# Patient Record
Sex: Male | Born: 1970 | Race: White | Hispanic: No | Marital: Married | Smoking: Former smoker
Health system: Southern US, Community
[De-identification: ages and names within clinical notes are randomized; demographics above are authoritative.]

## PROBLEM LIST (undated history)

## (undated) DIAGNOSIS — E786 Lipoprotein deficiency: Secondary | ICD-10-CM

## (undated) DIAGNOSIS — G4733 Obstructive sleep apnea (adult) (pediatric): Secondary | ICD-10-CM

## (undated) DIAGNOSIS — I10 Essential (primary) hypertension: Secondary | ICD-10-CM

## (undated) HISTORY — DX: Lipoprotein deficiency: E78.6

## (undated) HISTORY — PX: WRIST FRACTURE SURGERY: SHX121

## (undated) HISTORY — PX: APPENDECTOMY: SHX54

## (undated) HISTORY — DX: Obstructive sleep apnea (adult) (pediatric): G47.33

## (undated) HISTORY — DX: Essential (primary) hypertension: I10

---

## 2009-08-21 HISTORY — PX: DOPPLER ECHOCARDIOGRAPHY: SHX263

## 2010-03-23 ENCOUNTER — Ambulatory Visit: Payer: Self-pay | Admitting: Cardiology

## 2010-03-23 ENCOUNTER — Ambulatory Visit (HOSPITAL_COMMUNITY): Admission: RE | Admit: 2010-03-23 | Discharge: 2010-03-23 | Payer: Self-pay | Admitting: Cardiology

## 2010-04-08 ENCOUNTER — Ambulatory Visit: Payer: Self-pay | Admitting: Cardiology

## 2012-01-01 ENCOUNTER — Encounter: Payer: Self-pay | Admitting: *Deleted

## 2012-07-27 ENCOUNTER — Encounter (HOSPITAL_BASED_OUTPATIENT_CLINIC_OR_DEPARTMENT_OTHER): Payer: Self-pay | Admitting: *Deleted

## 2012-07-27 ENCOUNTER — Emergency Department (HOSPITAL_BASED_OUTPATIENT_CLINIC_OR_DEPARTMENT_OTHER)
Admission: EM | Admit: 2012-07-27 | Discharge: 2012-07-27 | Disposition: A | Discharge status: Home / Self Care | Payer: BC Managed Care – PPO | Attending: Emergency Medicine | Admitting: Emergency Medicine

## 2012-07-27 DIAGNOSIS — Z79899 Other long term (current) drug therapy: Secondary | ICD-10-CM | POA: Insufficient documentation

## 2012-07-27 DIAGNOSIS — Z87891 Personal history of nicotine dependence: Secondary | ICD-10-CM | POA: Insufficient documentation

## 2012-07-27 DIAGNOSIS — L03115 Cellulitis of right lower limb: Secondary | ICD-10-CM

## 2012-07-27 DIAGNOSIS — M7989 Other specified soft tissue disorders: Secondary | ICD-10-CM | POA: Insufficient documentation

## 2012-07-27 DIAGNOSIS — I10 Essential (primary) hypertension: Secondary | ICD-10-CM | POA: Insufficient documentation

## 2012-07-27 DIAGNOSIS — G4733 Obstructive sleep apnea (adult) (pediatric): Secondary | ICD-10-CM | POA: Insufficient documentation

## 2012-07-27 DIAGNOSIS — L02419 Cutaneous abscess of limb, unspecified: Secondary | ICD-10-CM | POA: Insufficient documentation

## 2012-07-27 DIAGNOSIS — L03119 Cellulitis of unspecified part of limb: Secondary | ICD-10-CM | POA: Insufficient documentation

## 2012-07-27 MED ORDER — DOXYCYCLINE HYCLATE 100 MG PO CAPS: 100.0000 mg | ORAL_CAPSULE | Freq: Two times a day (BID) | ORAL | Status: DC

## 2012-07-27 MED ORDER — DEXTROSE 5 % IV SOLN
1.0000 g | INTRAVENOUS | Status: DC
  Administered 2012-07-27: 1 g via INTRAVENOUS
  Filled 2012-07-27: qty 10

## 2012-07-27 MED ORDER — VANCOMYCIN HCL IN DEXTROSE 1-5 GM/200ML-% IV SOLN
1000.0000 mg | Freq: Once | INTRAVENOUS | Status: AC
  Administered 2012-07-27: 1000 mg via INTRAVENOUS
  Filled 2012-07-27: qty 200

## 2012-07-27 NOTE — ED Notes (Signed)
Pt states his right lower leg has been red and painful for 7-10 days s/p "frostbite from icing. Has had xray and ultrasound-neg. Saw PCP on Monday. Started Augmentin on Monday. Was getting better, but has now started spreading again.

## 2012-07-27 NOTE — ED Provider Notes (Signed)
History     CSN: [id number]  Arrival date & time 07/27/12  1353   None     Chief Complaint  Patient presents with  . Leg Pain    (Consider location/radiation/quality/duration/timing/severity/associated sxs/prior treatment) Patient is a 41 y.o. male presenting with leg pain. The history is provided by the patient. No language interpreter was used.  Leg Pain  Incident onset: 7 days. The incident occurred at home. Injury mechanism: direct blow. The pain is present in the right leg. The quality of the pain is described as aching. The pain is at a severity of 6/10. The pain is moderate. The pain has been constant since onset. He has tried nothing for the symptoms.   Pt reports he hit leg approx 10 days ago.  Pt iced area and ended up with skin damage, peeled,   Pt reports he was diagnosed with cellulitis on Monday.   Pt had a normal xray and doppler.  Pt has been on augmentin.  Pt reports increased area of redness and swelling today Past Medical History  Diagnosis Date  . Hypertension   . OSA (obstructive sleep apnea)   . Low HDL (under 40)     Past Surgical History  Procedure Date  . Wrist fracture surgery   . Doppler echocardiography 2011    Mild LVH with normal LV systolic function;impaired LV relaxation, Normal LV regional wall motion    Family History  Problem Relation Age of Onset  . Heart failure Father     CABG  . Cancer Mother   . Hypertension Sister     History  Substance Use Topics  . Smoking status: Former Smoker    Types: Cigarettes    Quit date: 08/21/2002  . Smokeless tobacco: Not on file  . Alcohol Use: 4.8 oz/week    8 Cans of beer per week      Review of Systems  All other systems reviewed and are negative.    Allergies  Review of patient's allergies indicates no known allergies.  Home Medications   Current Outpatient Rx  Name  Route  Sig  Dispense  Refill  . AMLODIPINE-OLMESARTAN 10-40 MG PO TABS   Oral   Take 1 tablet by mouth  daily.         . AMOXICILLIN-POT CLAVULANATE 875-125 MG PO TABS   Oral   Take 1 tablet by mouth 2 (two) times daily.         Marland Kitchen GABAPENTIN 300 MG PO CAPS   Oral   Take 300 mg by mouth as needed.         Marland Kitchen HYDROCHLOROTHIAZIDE 12.5 MG PO CAPS   Oral   Take 12.5 mg by mouth daily.           BP 162/90  Pulse 90  Temp 98.7 F (37.1 C) (Oral)  Resp 20  Ht 6\' 2"  (1.88 m)  Wt 350 lb (158.759 kg)  BMI 44.94 kg/m2  SpO2 98%  Physical Exam  Nursing note and vitals reviewed. Constitutional: He is oriented to person, place, and time. He appears well-developed and well-nourished.  HENT:  Head: Normocephalic and atraumatic.  Cardiovascular: Normal rate.   Pulmonary/Chest: Effort normal.  Abdominal: Soft.  Musculoskeletal: He exhibits tenderness.       Swollen red lower leg,    Neurological: He is alert and oriented to person, place, and time. He has normal reflexes.  Skin: There is erythema.  Psychiatric: He has a normal mood and affect.  ED Course  Procedures (including critical care time)  Labs Reviewed - No data to display No results found.   No diagnosis found.    MDM  Pt given rocephin and vancomycin here.   Pt advised to return here tomorrow for recheck.  Pt started on rx for doxycycline       Lonia Skinner [place], [place] 07/27/12 1805

## 2012-07-28 ENCOUNTER — Encounter (HOSPITAL_BASED_OUTPATIENT_CLINIC_OR_DEPARTMENT_OTHER): Payer: Self-pay | Admitting: *Deleted

## 2012-07-28 ENCOUNTER — Emergency Department (HOSPITAL_BASED_OUTPATIENT_CLINIC_OR_DEPARTMENT_OTHER)
Admission: EM | Admit: 2012-07-28 | Discharge: 2012-07-28 | Disposition: A | Discharge status: Home / Self Care | Payer: BC Managed Care – PPO | Attending: Emergency Medicine | Admitting: Emergency Medicine

## 2012-07-28 DIAGNOSIS — Z79899 Other long term (current) drug therapy: Secondary | ICD-10-CM | POA: Insufficient documentation

## 2012-07-28 DIAGNOSIS — E785 Hyperlipidemia, unspecified: Secondary | ICD-10-CM | POA: Insufficient documentation

## 2012-07-28 DIAGNOSIS — L02419 Cutaneous abscess of limb, unspecified: Secondary | ICD-10-CM | POA: Insufficient documentation

## 2012-07-28 DIAGNOSIS — G4733 Obstructive sleep apnea (adult) (pediatric): Secondary | ICD-10-CM | POA: Insufficient documentation

## 2012-07-28 DIAGNOSIS — I10 Essential (primary) hypertension: Secondary | ICD-10-CM | POA: Insufficient documentation

## 2012-07-28 DIAGNOSIS — Z87891 Personal history of nicotine dependence: Secondary | ICD-10-CM | POA: Insufficient documentation

## 2012-07-28 DIAGNOSIS — L03115 Cellulitis of right lower limb: Secondary | ICD-10-CM

## 2012-07-28 MED ORDER — DEXTROSE 5 % IV SOLN
1.0000 g | INTRAVENOUS | Status: DC
  Administered 2012-07-28: 1 g via INTRAVENOUS
  Filled 2012-07-28: qty 10

## 2012-07-28 MED ORDER — SODIUM CHLORIDE 0.9 % IV SOLN
Freq: Once | INTRAVENOUS | Status: AC
  Administered 2012-07-28: 19:00:00 via INTRAVENOUS

## 2012-07-28 MED ORDER — VANCOMYCIN HCL IN DEXTROSE 1-5 GM/200ML-% IV SOLN
1000.0000 mg | Freq: Once | INTRAVENOUS | Status: AC
  Administered 2012-07-28: 1000 mg via INTRAVENOUS
  Filled 2012-07-28: qty 200

## 2012-07-28 NOTE — ED Provider Notes (Signed)
History     CSN: [id number]  Arrival date & time 07/28/12  1602   First MD Initiated Contact with Patient 07/28/12 1731      No chief complaint on file.   (Consider location/radiation/quality/duration/timing/severity/associated sxs/prior treatment) Patient is a 41 y.o. male presenting with leg pain. The history is provided by the patient. No language interpreter was used.  Leg Pain  The incident occurred yesterday. The incident occurred at home. There was no injury mechanism. The pain is present in the right leg. The quality of the pain is described as aching. The pain is mild. The pain has been worsening since onset. Pertinent negatives include no numbness.  Pt seen here by me yesterday, here for recheck.  Pt reports redness has not spread,  He has less pain.  Past Medical History  Diagnosis Date  . Hypertension   . OSA (obstructive sleep apnea)   . Low HDL (under 40)     Past Surgical History  Procedure Date  . Wrist fracture surgery   . Doppler echocardiography 2011    Mild LVH with normal LV systolic function;impaired LV relaxation, Normal LV regional wall motion    Family History  Problem Relation Age of Onset  . Heart failure Father     CABG  . Cancer Mother   . Hypertension Sister     History  Substance Use Topics  . Smoking status: Former Smoker    Types: Cigarettes    Quit date: 08/21/2002  . Smokeless tobacco: Not on file  . Alcohol Use: 4.8 oz/week    8 Cans of beer per week      Review of Systems  Skin: Positive for wound.  Neurological: Negative for numbness.  All other systems reviewed and are negative.    Allergies  Review of patient's allergies indicates no known allergies.  Home Medications   Current Outpatient Rx  Name  Route  Sig  Dispense  Refill  . AMLODIPINE-OLMESARTAN 10-40 MG PO TABS   Oral   Take 1 tablet by mouth daily.         . AMOXICILLIN-POT CLAVULANATE 875-125 MG PO TABS   Oral   Take 1 tablet by mouth 2 (two)  times daily.         Marland Kitchen DOXYCYCLINE HYCLATE 100 MG PO CAPS   Oral   Take 1 capsule (100 mg total) by mouth 2 (two) times daily.   20 capsule   0   . GABAPENTIN 300 MG PO CAPS   Oral   Take 300 mg by mouth as needed.         Marland Kitchen HYDROCHLOROTHIAZIDE 12.5 MG PO CAPS   Oral   Take 12.5 mg by mouth daily.           BP 144/80  Pulse 91  Temp 98.8 F (37.1 C) (Oral)  Resp 18  SpO2 97%  Physical Exam  Nursing note and vitals reviewed. Constitutional: He is oriented to person, place, and time. He appears well-developed and well-nourished.  HENT:  Head: Normocephalic and atraumatic.  Musculoskeletal: Normal range of motion.  Neurological: He is alert and oriented to person, place, and time. He has normal reflexes.  Skin: There is erythema.  Psychiatric: He has a normal mood and affect.    ED Course  Procedures (including critical care time)  Labs Reviewed - No data to display No results found.   1. Cellulitis of right leg       MDM  Pt given IV Rocephin  and vancomycin.  I advised him to see his Physician for recheck tomorrow.          Lonia Skinner [place], [place] 07/28/12 2153

## 2012-07-28 NOTE — ED Provider Notes (Signed)
Medical screening examination/treatment/procedure(s) were performed by non-physician practitioner and as supervising physician I was immediately available for consultation/collaboration.  Meade Hogeland, MD 07/28/12 0037 

## 2012-07-28 NOTE — ED Provider Notes (Signed)
Medical screening examination/treatment/procedure(s) were performed by non-physician practitioner and as supervising physician I was immediately available for consultation/collaboration.   Ralph Brouwer, MD 07/28/12 2306 

## 2012-07-28 NOTE — ED Notes (Signed)
Seen yesterday for right leg wound- here for recheck

## 2012-07-28 NOTE — ED Notes (Signed)
Pt verbalizes understanding to f/u with his PCP tomorrow

## 2014-10-02 ENCOUNTER — Emergency Department (HOSPITAL_BASED_OUTPATIENT_CLINIC_OR_DEPARTMENT_OTHER)
Admission: EM | Admit: 2014-10-02 | Discharge: 2014-10-02 | Disposition: A | Discharge status: Home / Self Care | Payer: 59 | Attending: Emergency Medicine | Admitting: Emergency Medicine

## 2014-10-02 ENCOUNTER — Emergency Department (HOSPITAL_BASED_OUTPATIENT_CLINIC_OR_DEPARTMENT_OTHER): Payer: 59

## 2014-10-02 ENCOUNTER — Encounter (HOSPITAL_BASED_OUTPATIENT_CLINIC_OR_DEPARTMENT_OTHER): Payer: Self-pay

## 2014-10-02 DIAGNOSIS — Z792 Long term (current) use of antibiotics: Secondary | ICD-10-CM | POA: Diagnosis not present

## 2014-10-02 DIAGNOSIS — R1013 Epigastric pain: Secondary | ICD-10-CM | POA: Diagnosis present

## 2014-10-02 DIAGNOSIS — R140 Abdominal distension (gaseous): Secondary | ICD-10-CM | POA: Insufficient documentation

## 2014-10-02 DIAGNOSIS — I10 Essential (primary) hypertension: Secondary | ICD-10-CM | POA: Insufficient documentation

## 2014-10-02 DIAGNOSIS — Z87891 Personal history of nicotine dependence: Secondary | ICD-10-CM | POA: Insufficient documentation

## 2014-10-02 DIAGNOSIS — K529 Noninfective gastroenteritis and colitis, unspecified: Secondary | ICD-10-CM | POA: Diagnosis not present

## 2014-10-02 DIAGNOSIS — Z8639 Personal history of other endocrine, nutritional and metabolic disease: Secondary | ICD-10-CM | POA: Insufficient documentation

## 2014-10-02 DIAGNOSIS — Z79899 Other long term (current) drug therapy: Secondary | ICD-10-CM | POA: Insufficient documentation

## 2014-10-02 DIAGNOSIS — Z8669 Personal history of other diseases of the nervous system and sense organs: Secondary | ICD-10-CM | POA: Insufficient documentation

## 2014-10-02 LAB — CBC WITH DIFFERENTIAL/PLATELET
BASOS ABS: 0 10*3/uL (ref 0.0–0.1)
BASOS PCT: 0 % (ref 0–1)
EOS ABS: 0 10*3/uL (ref 0.0–0.7)
EOS PCT: 0 % (ref 0–5)
HCT: 48.7 % (ref 39.0–52.0)
HEMOGLOBIN: 16.6 g/dL (ref 13.0–17.0)
Lymphocytes Relative: 4 % — ABNORMAL LOW (ref 12–46)
Lymphs Abs: 0.4 10*3/uL — ABNORMAL LOW (ref 0.7–4.0)
MCH: 26.8 pg (ref 26.0–34.0)
MCHC: 34.1 g/dL (ref 30.0–36.0)
MCV: 78.7 fL (ref 78.0–100.0)
MONO ABS: 0.4 10*3/uL (ref 0.1–1.0)
MONOS PCT: 4 % (ref 3–12)
Neutro Abs: 9.2 10*3/uL — ABNORMAL HIGH (ref 1.7–7.7)
Neutrophils Relative %: 92 % — ABNORMAL HIGH (ref 43–77)
Platelets: 247 10*3/uL (ref 150–400)
RBC: 6.19 MIL/uL — ABNORMAL HIGH (ref 4.22–5.81)
RDW: 14.4 % (ref 11.5–15.5)
WBC: 10 10*3/uL (ref 4.0–10.5)

## 2014-10-02 LAB — COMPREHENSIVE METABOLIC PANEL
ALBUMIN: 4.4 g/dL (ref 3.5–5.2)
ALT: 55 U/L — ABNORMAL HIGH (ref 0–53)
AST: 31 U/L (ref 0–37)
Alkaline Phosphatase: 79 U/L (ref 39–117)
Anion gap: 5 (ref 5–15)
BUN: 16 mg/dL (ref 6–23)
CALCIUM: 8.5 mg/dL (ref 8.4–10.5)
CO2: 27 mmol/L (ref 19–32)
CREATININE: 1.1 mg/dL (ref 0.50–1.35)
Chloride: 104 mmol/L (ref 96–112)
GFR calc Af Amer: >90 mL/min (ref >90)
GFR, EST NON AFRICAN AMERICAN: 79 mL/min — AB (ref >90)
Glucose, Bld: 123 mg/dL — ABNORMAL HIGH (ref 70–99)
Potassium: 3.4 mmol/L — ABNORMAL LOW (ref 3.5–5.1)
Sodium: 136 mmol/L (ref 135–145)
TOTAL PROTEIN: 7.5 g/dL (ref 6.0–8.3)
Total Bilirubin: 1.8 mg/dL — ABNORMAL HIGH (ref 0.3–1.2)

## 2014-10-02 LAB — LIPASE, BLOOD: Lipase: 23 U/L (ref 11–59)

## 2014-10-02 MED ORDER — FAMOTIDINE 20 MG PO TABS: 20.0000 mg | ORAL_TABLET | Freq: Two times a day (BID) | ORAL | Status: AC

## 2014-10-02 MED ORDER — ONDANSETRON 8 MG PO TBDP: 8.0000 mg | ORAL_TABLET | Freq: Three times a day (TID) | ORAL | Status: AC | PRN

## 2014-10-02 MED ORDER — IOHEXOL 300 MG/ML  SOLN
100.0000 mL | Freq: Once | INTRAMUSCULAR | Status: AC | PRN
  Administered 2014-10-02: 100 mL via INTRAVENOUS

## 2014-10-02 MED ORDER — IOHEXOL 300 MG/ML  SOLN
25.0000 mL | Freq: Once | INTRAMUSCULAR | Status: AC | PRN
  Administered 2014-10-02: 25 mL via ORAL

## 2014-10-02 MED ORDER — GI COCKTAIL ~~LOC~~
30.0000 mL | Freq: Once | ORAL | Status: AC
  Administered 2014-10-02: 30 mL via ORAL
  Filled 2014-10-02: qty 30

## 2014-10-02 NOTE — ED Notes (Signed)
Returned from CT.

## 2014-10-02 NOTE — ED Notes (Signed)
Patient transported to CT 

## 2014-10-02 NOTE — ED Notes (Signed)
PT reports upper abdominal pain, distention, feeling of being bloated, constipation, nausea, one episode of intentional vomiting and diarrhea since last night.

## 2014-10-02 NOTE — ED Provider Notes (Signed)
CSN: [id number]     Arrival date & time 10/02/14  1304 History   First MD Initiated Contact with Patient 10/02/14 1326     Chief Complaint  Patient presents with  . Abdominal Pain     (Consider location/radiation/quality/duration/timing/severity/associated sxs/prior Treatment) HPI Tommy Flores is a 44 y.o. male with history of hypertension, obstructive sleep apnea, presents to emergency department complaining of abdominal pain and distention. Patient states pain started last night after eating. He reports epigastric discomfort which does not radiate. States "it feels like I have something sitting in my bowels in this area and is not moving through." Pt at first very concerned that he has not had a bowel movement in 24 hrs, states normally has one several times a day, but then admits to having diarrhea last episode few hours ago.  Patient admits to nausea, denies vomiting. He states he took Gas-X and Maalox with no symptom improvement. He states he tried to make himself vomit last night, and he did, and states he got relieved just enough to fall asleep. This morning his symptoms continue. He denies any prior history of small bowel obstruction, no history of pancreatitis, no prior abdominal surgeries. He admits to alcohol drinking, but states it is not daily. He is a former smoker. Deneis blood in his   Past Medical History  Diagnosis Date  . Hypertension   . OSA (obstructive sleep apnea)   . Low HDL (under 40)    Past Surgical History  Procedure Laterality Date  . Wrist fracture surgery    . Doppler echocardiography  2011    Mild LVH with normal LV systolic function;impaired LV relaxation, Normal LV regional wall motion  . Appendectomy     Family History  Problem Relation Age of Onset  . Heart failure Father     CABG  . Cancer Mother   . Hypertension Sister    History  Substance Use Topics  . Smoking status: Former Smoker    Types: Cigarettes    Quit date: 08/21/2002  . Smokeless  tobacco: Not on file  . Alcohol Use: 4.8 oz/week    8 Cans of beer per week    Review of Systems  Constitutional: Negative for fever and chills.  Respiratory: Negative for cough, chest tightness and shortness of breath.   Cardiovascular: Negative for chest pain, palpitations and leg swelling.  Gastrointestinal: Positive for nausea, abdominal pain, diarrhea and abdominal distention. Negative for vomiting and blood in stool.  Genitourinary: Negative for dysuria, urgency, frequency and hematuria.  Musculoskeletal: Negative for myalgias, arthralgias, neck pain and neck stiffness.  Skin: Negative for rash.  Allergic/Immunologic: Negative for immunocompromised state.  Neurological: Negative for dizziness, weakness, light-headedness, numbness and headaches.  All other systems reviewed and are negative.     Allergies  Review of patient's allergies indicates no known allergies.  Home Medications   Prior to Admission medications   Medication Sig Start Date End Date Taking? Authorizing Provider  amLODipine (NORVASC) 10 MG tablet Take 10 mg by mouth daily.   Yes Historical Provider, MD  gabapentin (NEURONTIN) 300 MG capsule Take 300 mg by mouth as needed.   Yes Historical Provider, MD  hydrochlorothiazide (MICROZIDE) 12.5 MG capsule Take 25 mg by mouth daily.    Yes Historical Provider, MD  losartan (COZAAR) 50 MG tablet Take 50 mg by mouth daily.   Yes Historical Provider, MD  methylphenidate (RITALIN) 20 MG tablet Take 30 mg by mouth 2 (two) times daily.   Yes Historical  Provider, MD  omeprazole (PRILOSEC) 40 MG capsule Take 40 mg by mouth 2 (two) times daily.   Yes Historical Provider, MD  amLODipine-olmesartan (AZOR) 10-40 MG per tablet Take 1 tablet by mouth daily.    Historical Provider, MD  amoxicillin-clavulanate (AUGMENTIN) 875-125 MG per tablet Take 1 tablet by mouth 2 (two) times daily.    Historical Provider, MD  doxycycline (VIBRAMYCIN) 100 MG capsule Take 1 capsule (100 mg total)  by mouth 2 (two) times daily. 07/27/12   Elson Areas, PA-C   BP 153/75 mmHg  Pulse 111  Temp(Src) 98.3 F (36.8 C) (Oral)  Resp 18  Ht  (1.88 m)  Wt 355 lb (161.027 kg)  BMI 45.56 kg/m2  SpO2 96% Physical Exam  Constitutional: He is oriented to person, place, and time. He appears well-developed and well-nourished. No distress.  HENT:  Head: Normocephalic and atraumatic.  Eyes: Conjunctivae are normal.  Neck: Neck supple.  Cardiovascular: Normal rate, regular rhythm and normal heart sounds.   Pulmonary/Chest: Effort normal. No respiratory distress. He has no wheezes. He has no rales.  Abdominal: Soft. Bowel sounds are normal. He exhibits no distension. There is no tenderness. There is no rebound and no guarding.  Musculoskeletal: He exhibits no edema.  Neurological: He is alert and oriented to person, place, and time.  Skin: Skin is warm and dry.  Nursing note and vitals reviewed.   ED Course  Procedures (including critical care time) Labs Review Labs Reviewed  CBC WITH DIFFERENTIAL/PLATELET - Abnormal; Notable for the following:    RBC 6.19 (*)    Neutrophils Relative % 92 (*)    Neutro Abs 9.2 (*)    Lymphocytes Relative 4 (*)    Lymphs Abs 0.4 (*)    All other components within normal limits  COMPREHENSIVE METABOLIC PANEL - Abnormal; Notable for the following:    Potassium 3.4 (*)    Glucose, Bld 123 (*)    ALT 55 (*)    Total Bilirubin 1.8 (*)    GFR calc non Af Amer 79 (*)    All other components within normal limits  LIPASE, BLOOD    Imaging Review Ct Abdomen Pelvis W Contrast  10/02/2014   CLINICAL DATA:  Patient with upper abdominal pain and pressure. Nausea and diarrhea.  EXAM: CT ABDOMEN AND PELVIS WITH CONTRAST  TECHNIQUE: Multidetector CT imaging of the abdomen and pelvis was performed using the standard protocol following bolus administration of intravenous contrast.  CONTRAST:  25mL OMNIPAQUE IOHEXOL 300 MG/ML SOLN, OMNIPAQUE IOHEXOL 300  MG/ML SOLN  COMPARISON:  None.  FINDINGS: Lower chest: Visualization of the lower chest demonstrates bibasilar bronchiectasis and scarring. There is likely associated atelectasis. No pleural effusion. Normal heart size.  Hepatobiliary: Liver is normal in size and contour without focal hepatic lesion identified. The gallbladder is unremarkable.  Pancreas: Unremarkable  Spleen: Unremarkable  Adrenals/Urinary Tract: Adrenal glands are normal. Kidneys enhance symmetrically with contrast. No hydronephrosis. Urinary bladder is unremarkable.  Stomach/Bowel: Fluid throughout the colon likely related to history of diarrhea. Appendix is normal. No evidence for bowel obstruction.  Vascular/Lymphatic: Normal caliber abdominal aorta. No retroperitoneal lymphadenopathy. Multiple prominent subcentimeter mesenteric lymph nodes are identified.  Other: The prostate is unremarkable. Periumbilical fat containing hernia.  Musculoskeletal: No aggressive or acute appearing osseous lesions.  IMPRESSION: Multiple nonspecific prominent subcentimeter mesenteric lymph nodes, potentially infectious or inflammatory in etiology.  No evidence for bowel obstruction.  Fluid throughout the colon as can be seen with history of  diarrhea.   Electronically Signed   By: Annia Beltrew  Davis M.D.   On: 10/02/2014 16:15   Dg Abd 2 Views  10/02/2014   CLINICAL DATA:  Abdominal distention  EXAM: ABDOMEN - 2 VIEW  COMPARISON:  None.  FINDINGS: There are dilated and fluid filled loops of bowel in the central abdomen which have fold pattern suggesting small bowel. The colon is relatively decompressed, the stomach is distended. No evidence of pneumatosis of perforation. Lung bases are clear. No concerning intra-abdominal mass effect or calcification.  IMPRESSION: Abnormal bowel gas pattern, primarily concerning for early or partial small bowel obstruction.   Electronically Signed   By: Marnee SpringJonathon  Watts M.D.   On: 10/02/2014 15:05     EKG  Interpretation   Date/Time:  Friday October 02 2014 14:10:01 EST Ventricular Rate:  108 PR Interval:  162 QRS Duration: 76 QT Interval:  314 QTC Calculation: 420 R Axis:   27 Text Interpretation:  Sinus tachycardia Otherwise normal ECG Confirmed by  POLLINA  MD, CHRISTOPHER [phone]([id number]) on 10/02/2014 2:11:37 PM      MDM   Final diagnoses:  Abdominal distension  Epigastric pain  Enteritis   Patient with epigastric discomfort, currently denies pain, states "it just feels like it's full." Abdomen is benign. Is nontender. Will get labs including LFTs and lipase. Will get abdominal x-rays.  X-ray concerning for SBO. Will get CT abd/pelvis. Pt does not want any medications at this time.    4:31 PM CT negative for SBP. Showing some inflammatory lymphadenopathy, diarrhea. Most likely viral enteritis. Pt still does not want any medications in ED. Will dc home with symptomatic tx. Zofran for nausea. pepcid for belching and acid reflex. Follow up with pcp. Return precautions discussed. Abdomen non tender. Benign.   Filed Vitals:   10/02/14 1317 10/02/14 1517  BP: 153/75 151/81  Pulse: 111 105  Temp: 98.3 F (36.8 C)   TempSrc: Oral   Resp: 18 20  Height: 6\' 2"  (1.88 m)   Weight: 355 lb (161.027 kg)   SpO2: 96% 96%       Lottie Musselatyana A Kierah Goatley, PA-C 10/02/14 1632  Gilda Creasehristopher J. Pollina, MD 10/05/14 1421

## 2014-10-02 NOTE — Discharge Instructions (Signed)
Clear liquid diet for the next 24-48hrs. zofran as needed for nausea. Take pepcid for acid reflux. Follow up with your doctor next week. Return if worsening symptoms.    Abdominal Pain Many things can cause abdominal pain. Usually, abdominal pain is not caused by a disease and will improve without treatment. It can often be observed and treated at home. Your health care provider will do a physical exam and possibly order blood tests and X-rays to help determine the seriousness of your pain. However, in many cases, more time must pass before a clear cause of the pain can be found. Before that point, your health care provider may not know if you need more testing or further treatment. HOME CARE INSTRUCTIONS  Monitor your abdominal pain for any changes. The following actions may help to alleviate any discomfort you are experiencing:  Only take over-the-counter or prescription medicines as directed by your health care provider.  Do not take laxatives unless directed to do so by your health care provider.  Try a clear liquid diet (broth, tea, or water) as directed by your health care provider. Slowly move to a bland diet as tolerated. SEEK MEDICAL CARE IF:  You have unexplained abdominal pain.  You have abdominal pain associated with nausea or diarrhea.  You have pain when you urinate or have a bowel movement.  You experience abdominal pain that wakes you in the night.  You have abdominal pain that is worsened or improved by eating food.  You have abdominal pain that is worsened with eating fatty foods.  You have a fever. SEEK IMMEDIATE MEDICAL CARE IF:   Your pain does not go away within 2 hours.  You keep throwing up (vomiting).  Your pain is felt only in portions of the abdomen, such as the right side or the left lower portion of the abdomen.  You pass bloody or black tarry stools. MAKE SURE YOU:  Understand these instructions.   Will watch your condition.   Will get help  right away if you are not doing well or get worse.  Document Released: 05/17/2005 Document Revised: 08/12/2013 Document Reviewed: 04/16/2013 [facility] Patient Information 2015 [employer]. This information is not intended to replace advice given to you by your health care provider. Make sure you discuss any questions you have with your health care provider.

## 2016-07-10 ENCOUNTER — Encounter (HOSPITAL_BASED_OUTPATIENT_CLINIC_OR_DEPARTMENT_OTHER): Payer: Self-pay | Admitting: *Deleted

## 2016-07-10 ENCOUNTER — Emergency Department (HOSPITAL_BASED_OUTPATIENT_CLINIC_OR_DEPARTMENT_OTHER)
Admission: EM | Admit: 2016-07-10 | Discharge: 2016-07-10 | Disposition: A | Discharge status: Home / Self Care | Payer: 59 | Attending: Emergency Medicine | Admitting: Emergency Medicine

## 2016-07-10 ENCOUNTER — Emergency Department (HOSPITAL_BASED_OUTPATIENT_CLINIC_OR_DEPARTMENT_OTHER): Payer: 59

## 2016-07-10 DIAGNOSIS — Z87891 Personal history of nicotine dependence: Secondary | ICD-10-CM | POA: Diagnosis not present

## 2016-07-10 DIAGNOSIS — Y939 Activity, unspecified: Secondary | ICD-10-CM | POA: Insufficient documentation

## 2016-07-10 DIAGNOSIS — Y92002 Bathroom of unspecified non-institutional (private) residence single-family (private) house as the place of occurrence of the external cause: Secondary | ICD-10-CM | POA: Insufficient documentation

## 2016-07-10 DIAGNOSIS — S8992XA Unspecified injury of left lower leg, initial encounter: Secondary | ICD-10-CM | POA: Diagnosis present

## 2016-07-10 DIAGNOSIS — Y999 Unspecified external cause status: Secondary | ICD-10-CM | POA: Diagnosis not present

## 2016-07-10 DIAGNOSIS — I10 Essential (primary) hypertension: Secondary | ICD-10-CM | POA: Diagnosis not present

## 2016-07-10 DIAGNOSIS — L03116 Cellulitis of left lower limb: Secondary | ICD-10-CM | POA: Diagnosis not present

## 2016-07-10 DIAGNOSIS — S92155A Nondisplaced avulsion fracture (chip fracture) of left talus, initial encounter for closed fracture: Secondary | ICD-10-CM | POA: Diagnosis not present

## 2016-07-10 DIAGNOSIS — W010XXA Fall on same level from slipping, tripping and stumbling without subsequent striking against object, initial encounter: Secondary | ICD-10-CM | POA: Insufficient documentation

## 2016-07-10 MED ORDER — CEPHALEXIN 250 MG PO CAPS
1000.0000 mg | ORAL_CAPSULE | Freq: Once | ORAL | Status: AC
  Administered 2016-07-10: 1000 mg via ORAL
  Filled 2016-07-10: qty 4

## 2016-07-10 MED ORDER — ACETAMINOPHEN 500 MG PO TABS: 1000.0000 mg | ORAL_TABLET | Freq: Four times a day (QID) | ORAL | 0 refills | Status: AC | PRN

## 2016-07-10 MED ORDER — TRAMADOL HCL 50 MG PO TABS: 100.0000 mg | ORAL_TABLET | Freq: Four times a day (QID) | ORAL | 0 refills | Status: AC | PRN

## 2016-07-10 MED ORDER — CEPHALEXIN 500 MG PO CAPS: 1000.0000 mg | ORAL_CAPSULE | Freq: Two times a day (BID) | ORAL | 0 refills | Status: AC

## 2016-07-10 NOTE — ED Notes (Signed)
Pt. Does not want cruthces.

## 2016-07-10 NOTE — ED Provider Notes (Addendum)
[facility] Provider Note   CSN: [id number] Arrival date & time: 07/10/16  1932 By signing my name below, I, Bridgette HabermannMaria Tan, attest that this documentation has been prepared under the direction and in the presence of Arby BarretteMarcy Zorion Nims, MD. Electronically Signed: Bridgette HabermannMaria Tan, ED Scribe. 07/10/16. 9:04 PM.  History   Chief Complaint Chief Complaint  Patient presents with  . Leg Swelling   HPI Comments: Tommy Flores is a 45 y.o. male with h/o HTN who presents to the Emergency Department complaining of gradually worsening, left lower leg swelling with redness and intermittent pain s/p mechanical injury one week ago. Pt states he slipped and fell in the bathroom.He struck his medial lower leg fairly hard on the tub edge. No LOC. He denies head injury. Pain is exacerbated with standing and ambulating. Pt notes he did the same thing to his right leg four years ago and was diagnosed with cellulitis. Denies h/o DVT. He denies any additional injuries. Pt further denies fever, shortness of breath, chest pain, chills, or any other associated symptoms.   The history is provided by the patient. No language interpreter was used.    Past Medical History:  Diagnosis Date  . Hypertension   . Low HDL (under 40)   . OSA (obstructive sleep apnea)     There are no active problems to display for this patient.   Past Surgical History:  Procedure Laterality Date  . APPENDECTOMY    . DOPPLER ECHOCARDIOGRAPHY  2011   Mild LVH with normal LV systolic function;impaired LV relaxation, Normal LV regional wall motion  . WRIST FRACTURE SURGERY         Home Medications    Prior to Admission medications   Medication Sig Start Date End Date Taking? Authorizing Provider  acetaminophen (TYLENOL) 500 MG tablet Take 2 tablets (1,000 mg total) by mouth every 6 (six) hours as needed. 07/10/16   Arby BarretteMarcy Dujuan Stankowski, MD  amLODipine (NORVASC) 10 MG tablet Take 10 mg by mouth daily.    Historical Provider, MD    amLODipine-olmesartan (AZOR) 10-40 MG per tablet Take 1 tablet by mouth daily.    Historical Provider, MD  amoxicillin-clavulanate (AUGMENTIN) 875-125 MG per tablet Take 1 tablet by mouth 2 (two) times daily.    Historical Provider, MD  cephALEXin (KEFLEX) 500 MG capsule Take 2 capsules (1,000 mg total) by mouth 2 (two) times daily. 07/10/16   Arby BarretteMarcy Armanie Martine, MD  famotidine (PEPCID) 20 MG tablet Take 1 tablet (20 mg total) by mouth 2 (two) times daily. 10/02/14   Tatyana Kirichenko, PA-C  gabapentin (NEURONTIN) 300 MG capsule Take 300 mg by mouth as needed.    Historical Provider, MD  hydrochlorothiazide (MICROZIDE) 12.5 MG capsule Take 25 mg by mouth daily.     Historical Provider, MD  losartan (COZAAR) 50 MG tablet Take 50 mg by mouth daily.    Historical Provider, MD  methylphenidate (RITALIN) 20 MG tablet Take 30 mg by mouth 2 (two) times daily.    Historical Provider, MD  omeprazole (PRILOSEC) 40 MG capsule Take 40 mg by mouth 2 (two) times daily.    Historical Provider, MD  ondansetron (ZOFRAN ODT) 8 MG disintegrating tablet Take 1 tablet (8 mg total) by mouth every 8 (eight) hours as needed for nausea or vomiting. 10/02/14   Jaynie Crumbleatyana Kirichenko, PA-C  traMADol (ULTRAM) 50 MG tablet Take 2 tablets (100 mg total) by mouth every 6 (six) hours as needed. 07/10/16   Arby BarretteMarcy Zaylee Cornia, MD    Family History  Family History  Problem Relation Age of Onset  . Cancer Mother   . Heart failure Father     CABG  . Hypertension Sister     Social History Social History  Substance Use Topics  . Smoking status: Former Smoker    Types: Cigarettes    Quit date: 08/21/2002  . Smokeless tobacco: Never Used  . Alcohol use 4.8 oz/week    8 Cans of beer per week     Allergies   Patient has no known allergies.   Review of Systems Review of Systems Constitutional: No recent fevers or chills Respiratory: No shortness of breath or cough or chest pain Physical Exam Updated Vital Signs BP 153/88 (BP  Location: Right Arm)   Pulse 76   Temp 98.3 F (36.8 C) (Oral)   Resp 20   Ht 6\' 1"  (1.854 m)   Wt (!) 370 lb (167.8 kg)   SpO2 96%   BMI 48.82 kg/m   Physical Exam  Constitutional: He appears well-developed and well-nourished.  HENT:  Head: Normocephalic.  Eyes: Conjunctivae are normal.  Cardiovascular: Normal rate, regular rhythm and normal heart sounds.  Exam reveals no gallop and no friction rub.   No murmur heard. Pulmonary/Chest: Effort normal and breath sounds normal. No respiratory distress. He has no wheezes. He has no rales.  Abdominal: He exhibits no distension.  Musculoskeletal: Normal range of motion. He exhibits edema.  2+ edema LLE. Erythema 20 cm  x 15 cm. medial lower leg. No ulceration or open wound.  Neurological: He is alert.  Skin: Skin is warm and dry.  Psychiatric: He has a normal mood and affect. His behavior is normal.  Nursing note and vitals reviewed.    ED Treatments / Results  DIAGNOSTIC STUDIES: Oxygen Saturation is 98% on RA, normal by my interpretation.    COORDINATION OF CARE: 9:03 PM Discussed treatment plan with pt at bedside which includes Rx antibiotics and x-ray and pt agreed to plan.  Labs (all labs ordered are listed, but only abnormal results are displayed) Labs Reviewed - No data to display  EKG  EKG Interpretation None       Radiology Dg Tibia/fibula Left  Result Date: 07/10/2016 CLINICAL DATA:  Fall in bathtub 6 days ago with pain and swelling EXAM: LEFT TIBIA AND FIBULA - 2 VIEW COMPARISON:  None. FINDINGS: There is moderate diffuse subcutaneous edema. No fracture seen involving the proximal tibia or fibula. Accessory os or old avulsion injury lateral fibular malleolus. Questionable tiny cortical fracture involving the medial talus. Marked soft tissue swelling and thickening at the ankle. No radiopaque foreign body. IMPRESSION: 1. Questionable tiny cortical fracture of the medial inferior talus. 2. Otherwise no acute  osseous abnormality. Moderate diffuse subcutaneous edema ; marked skin thickening at the ankle. Electronically Signed   By: Jasmine Pang M.D.   On: 07/10/2016 21:37   US Venous Img Lower Unilateral Left  Result Date: 07/10/2016 CLINICAL DATA:  Patient fell in the shower last Wednesday and now has ankle swelling, pain, and redness. EXAM: Left LOWER EXTREMITY VENOUS DOPPLER ULTRASOUND TECHNIQUE: Gray-scale sonography with graded compression, as well as color Doppler and duplex ultrasound were performed to evaluate the lower extremity deep venous systems from the level of the common femoral vein and including the common femoral, femoral, profunda femoral, popliteal and calf veins including the posterior tibial, peroneal and gastrocnemius veins when visible. The superficial great saphenous vein was also interrogated. Spectral Doppler was utilized to evaluate flow at rest  and with distal augmentation maneuvers in the common femoral, femoral and popliteal veins. COMPARISON:  None. FINDINGS: Contralateral Common Femoral Vein: Respiratory phasicity is normal and symmetric with the symptomatic side. No evidence of thrombus. Normal compressibility. Common Femoral Vein: No evidence of thrombus. Normal compressibility, respiratory phasicity and response to augmentation. Saphenofemoral Junction: No evidence of thrombus. Normal compressibility and flow on color Doppler imaging. Profunda Femoral Vein: No evidence of thrombus. Normal compressibility and flow on color Doppler imaging. Femoral Vein: No evidence of thrombus. Normal compressibility, respiratory phasicity and response to augmentation. Popliteal Vein: No evidence of thrombus. Normal compressibility, respiratory phasicity and response to augmentation. Calf Veins: No evidence of thrombus. Normal compressibility and flow on color Doppler imaging. Peroneal veins were not seen due to soft tissue swelling. Superficial Great Saphenous Vein: No evidence of thrombus. Normal  compressibility and flow on color Doppler imaging. Venous Reflux:  None. Other Findings:  None. IMPRESSION: No evidence of deep venous thrombosis. Electronically Signed   By: Burman NievesWilliam  Stevens M.D.   On: 07/10/2016 22:16    Procedures Procedures (including critical care time)  Medications Ordered in ED Medications  cephALEXin (KEFLEX) capsule 1,000 mg (1,000 mg Oral Given 07/10/16 2121)     Initial Impression / Assessment and Plan / ED Course  I have reviewed the triage vital signs and the nursing notes.  Pertinent labs & imaging results that were available during my care of the patient were reviewed by me and considered in my medical decision making (see chart for details).  Clinical Course     Final Clinical Impressions(s) / ED Diagnoses   Final diagnoses:  Cellulitis of left lower extremity  Nondisp avuls fracture (chip fracture) of left talus, init   Patient fell approximately week ago and directly had traumatic injury to his lower leg. He has however had increased swelling of the lower leg. He also has erythema that is concentrated more to the medial lower leg. Concern was for possible DVT which was ruled out by ultrasound. X-ray shows a small chip avulsion fracture. This is stable. Patient be placed in an Aircast and crutches. Cellulitis of a treated with Keflex. New Prescriptions New Prescriptions   ACETAMINOPHEN (TYLENOL) 500 MG TABLET    Take 2 tablets (1,000 mg total) by mouth every 6 (six) hours as needed.   CEPHALEXIN (KEFLEX) 500 MG CAPSULE    Take 2 capsules (1,000 mg total) by mouth 2 (two) times daily.   TRAMADOL (ULTRAM) 50 MG TABLET    Take 2 tablets (100 mg total) by mouth every 6 (six) hours as needed.       Arby BarretteMarcy Tyiesha Brackney, MD 07/10/16 [id number]    Arby BarretteMarcy Jayden Rudge, MD 07/10/16 2245

## 2016-07-10 NOTE — ED Notes (Signed)
Patient transported to X-ray 

## 2016-07-10 NOTE — ED Triage Notes (Signed)
Pt left lower leg swelling , 1 week ago injury

## 2016-07-10 NOTE — ED Notes (Signed)
Pt in ultrasound

## 2021-06-16 ENCOUNTER — Emergency Department (HOSPITAL_COMMUNITY)
Admission: EM | Admit: 2021-06-16 | Discharge: 2021-06-16 | Disposition: A | Discharge status: Home / Self Care | Payer: BC Managed Care – PPO | Attending: Physician Assistant | Admitting: Physician Assistant

## 2021-06-16 ENCOUNTER — Emergency Department (HOSPITAL_COMMUNITY): Payer: BC Managed Care – PPO

## 2021-06-16 ENCOUNTER — Encounter (HOSPITAL_COMMUNITY): Payer: Self-pay

## 2021-06-16 DIAGNOSIS — Z5321 Procedure and treatment not carried out due to patient leaving prior to being seen by health care provider: Secondary | ICD-10-CM | POA: Insufficient documentation

## 2021-06-16 DIAGNOSIS — R079 Chest pain, unspecified: Secondary | ICD-10-CM | POA: Insufficient documentation

## 2021-06-16 DIAGNOSIS — R52 Pain, unspecified: Secondary | ICD-10-CM

## 2021-06-16 LAB — BASIC METABOLIC PANEL
Anion gap: 6 (ref 5–15)
BUN: 12 mg/dL (ref 6–20)
CO2: 30 mmol/L (ref 22–32)
Calcium: 9 mg/dL (ref 8.9–10.3)
Chloride: 102 mmol/L (ref 98–111)
Creatinine, Ser: 1.12 mg/dL (ref 0.61–1.24)
GFR, Estimated: >60 mL/min (ref >60)
Glucose, Bld: 134 mg/dL — ABNORMAL HIGH (ref 70–99)
Potassium: 3.8 mmol/L (ref 3.5–5.1)
Sodium: 138 mmol/L (ref 135–145)

## 2021-06-16 LAB — CBC
HCT: 54.4 % — ABNORMAL HIGH (ref 39.0–52.0)
Hemoglobin: 18 g/dL — ABNORMAL HIGH (ref 13.0–17.0)
MCH: 26.4 pg (ref 26.0–34.0)
MCHC: 33.1 g/dL (ref 30.0–36.0)
MCV: 79.9 fL — ABNORMAL LOW (ref 80.0–100.0)
Platelets: 231 10*3/uL (ref 150–400)
RBC: 6.81 MIL/uL — ABNORMAL HIGH (ref 4.22–5.81)
RDW: 16.6 % — ABNORMAL HIGH (ref 11.5–15.5)
WBC: 5.5 10*3/uL (ref 4.0–10.5)
nRBC: 0 % (ref 0.0–0.2)

## 2021-06-16 LAB — D-DIMER, QUANTITATIVE: D-Dimer, Quant: <0.27 ug/mL-FEU (ref 0.00–0.50)

## 2021-06-16 LAB — TROPONIN I (HIGH SENSITIVITY): Troponin I (High Sensitivity): 8 ng/L (ref <18)

## 2021-06-16 NOTE — ED Triage Notes (Signed)
Pt states pinching with breathing in left rib area while laying down since yesterday. Pt states pain was relieved with sitting up. Pt states PCP sent him here to r/o pulmonary embolism. Pt lost his brother to a PE, so is a bit anxious about it.

## 2021-06-16 NOTE — ED Provider Notes (Signed)
Emergency Medicine Provider Triage Evaluation Note  Tommy Flores , a 50 y.o. male  was evaluated in triage.  Pt complains of chest pain that started last night. Called pcp who was concerned for pe so sent him here  Review of Systems  Positive: Chest pain Negative: sob  Physical Exam  BP (!) 202/105   Pulse 72   Temp 98.9 F (37.2 C) (Oral)   Resp 18   Ht 6\' 1"  (1.854 m)   Wt (!) 174.6 kg   SpO2 96%   BMI 50.79 kg/m  Gen:   Awake, no distress   Resp:  Normal effort  MSK:   Moves extremities without difficulty  Other:  Heart with rrr, lungs ctab  Medical Decision Making  Medically screening exam initiated at 2:12 PM.  Appropriate orders placed.  Tommy Flores was informed that the remainder of the evaluation will be completed by another provider, this initial triage assessment does not replace that evaluation, and the importance of remaining in the ED until their evaluation is complete.     Tommy Maxin, PA-C 06/16/21 1416    06/18/21, MD 06/16/21 1517

## 2022-07-25 IMAGING — CR DG CHEST 1V
1 series · 1 of 1 positions shown · non-contrast
Comparison: None.

CLINICAL DATA: Chest pain

EXAM:
CHEST  1 VIEW

[w chest pa]
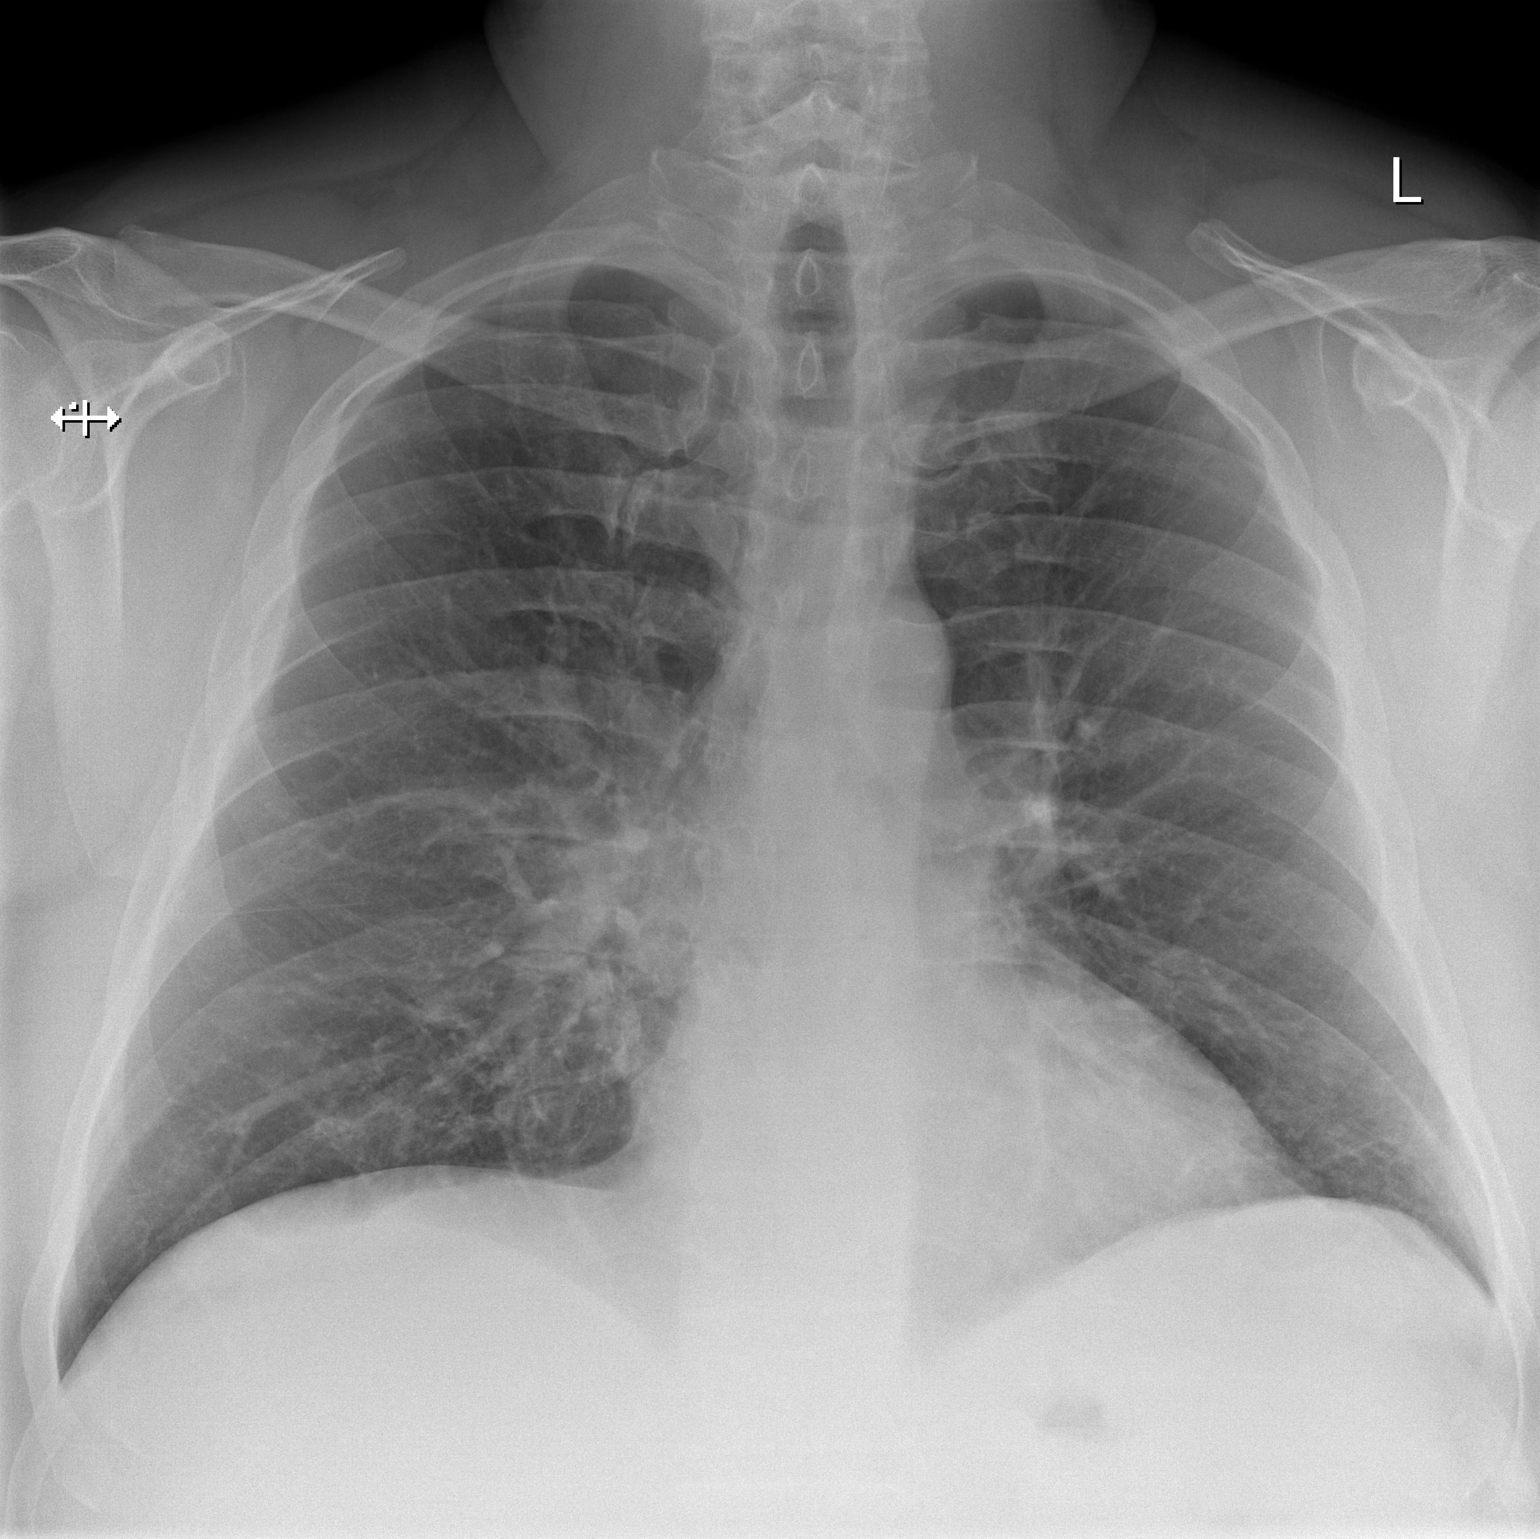

[1 of 1 positions shown; findings below may reference images not displayed]

FINDINGS: The heart size and mediastinal contours are within normal limits.
Both lungs are clear. The visualized skeletal structures are
unremarkable.
IMPRESSION: No acute abnormality of the lungs in frontal projection.
